# Patient Record
Sex: Male | Born: 1995 | Race: White | Hispanic: No | Marital: Single | Smoking: Never smoker
Health system: Southern US, Community
[De-identification: ages and names within clinical notes are randomized; demographics above are authoritative.]

---
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
## 2018-08-31 ENCOUNTER — Ambulatory Visit (HOSPITAL_COMMUNITY)
Admission: EM | Admit: 2018-08-31 | Discharge: 2018-08-31 | Disposition: A | Discharge status: Home / Self Care | Payer: Federal, State, Local not specified - PPO | Attending: Family Medicine | Admitting: Family Medicine

## 2018-08-31 ENCOUNTER — Other Ambulatory Visit: Payer: Self-pay

## 2018-08-31 ENCOUNTER — Encounter (HOSPITAL_COMMUNITY): Payer: Self-pay | Admitting: Emergency Medicine

## 2018-08-31 DIAGNOSIS — Z20828 Contact with and (suspected) exposure to other viral communicable diseases: Secondary | ICD-10-CM | POA: Diagnosis not present

## 2018-08-31 DIAGNOSIS — B9789 Other viral agents as the cause of diseases classified elsewhere: Secondary | ICD-10-CM

## 2018-08-31 DIAGNOSIS — R05 Cough: Secondary | ICD-10-CM | POA: Diagnosis present

## 2018-08-31 DIAGNOSIS — J069 Acute upper respiratory infection, unspecified: Secondary | ICD-10-CM | POA: Insufficient documentation

## 2018-08-31 NOTE — ED Provider Notes (Signed)
[facility]    CSN: [id number] Arrival date & time: 08/31/18  1502     History   Chief Complaint Chief Complaint  Patient presents with  . Cough    HPI Marc Kerr is a 23 y.o. male.   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  He just got back from a recent cruise to [place] and then visited [address]  He states that he went to [employer] on 08/26/2018.  Traveled home on 08/27/2018.  Began having symptoms on 08/29/2018.  For 2 days now has had body aches, low-grade fever, coughing and chest congestion, mild runny stuffy nose.  No sore throat.  No nausea vomiting diarrhea.  He has not taken his temperature much but he does not think it is been over 99-100. He does understand that being around large groups of people in [place] may increase his risk of exposure to coronavirus ([id number])  History reviewed. No pertinent past medical history.  There are no active problems to display for this patient.   History reviewed. No pertinent surgical history.     Home Medications    Prior to Admission medications   Not on File    Family History Family History  Problem Relation Age of Onset  . Healthy Mother   . Healthy Father     Social History Social History   Tobacco Use  . Smoking status: Never Smoker  . Smokeless tobacco: Never Used  Substance Use Topics  . Alcohol use: Not Currently    Frequency: Never  . Drug use: Never     Allergies   Patient has no known allergies.   Review of Systems Review of Systems  Constitutional: Positive for fever. Negative for chills.  HENT: Positive for congestion, postnasal drip and rhinorrhea. Negative for ear pain and sore throat.   Eyes: Negative for pain and visual disturbance.  Respiratory: Positive for cough. Negative for shortness of breath.   Cardiovascular: Negative for chest pain and palpitations.  Gastrointestinal: Negative for abdominal pain and vomiting.  Genitourinary: Negative for dysuria  and hematuria.  Musculoskeletal: Positive for myalgias. Negative for arthralgias and back pain.  Skin: Negative for color change and rash.  Neurological: Negative for seizures and syncope.  All other systems reviewed and are negative.    Physical Exam Triage Vital Signs ED Triage Vitals  Enc Vitals Group     BP 08/31/18 1519 102/61     Pulse Rate 08/31/18 1519 79     Resp 08/31/18 1519 16     Temp 08/31/18 1519 98.8 F (37.1 C)     Temp Source 08/31/18 1519 Oral     SpO2 08/31/18 1519 98 %   No data found.  Updated Vital Signs BP 102/61 (BP Location: Left Arm)   Pulse 79   Temp 98.8 F (37.1 C) (Oral) Comment: Pt has not taken any medications today  Resp 16   SpO2 98%      Physical Exam Constitutional:      General: He is not in acute distress.    Appearance: He is well-developed and normal weight. He is not ill-appearing.  HENT:     Head: Normocephalic and atraumatic.     Right Ear: Tympanic membrane and ear canal normal.     Left Ear: Tympanic membrane and ear canal normal.     Nose: Nose normal. No rhinorrhea.     Mouth/Throat:     Pharynx: No posterior oropharyngeal erythema.  Eyes:  Conjunctiva/sclera: Conjunctivae normal.     Pupils: Pupils are equal, round, and reactive to light.  Neck:     Musculoskeletal: Normal range of motion.  Cardiovascular:     Rate and Rhythm: Normal rate and regular rhythm.     Heart sounds: Normal heart sounds.  Pulmonary:     Effort: Pulmonary effort is normal. No respiratory distress.     Breath sounds: Normal breath sounds. No wheezing, rhonchi or rales.  Abdominal:     General: There is no distension.     Palpations: Abdomen is soft.  Musculoskeletal: Normal range of motion.  Lymphadenopathy:     Cervical: No cervical adenopathy.  Skin:    General: Skin is warm and dry.  Neurological:     Mental Status: He is alert.  Psychiatric:        Mood and Affect: Mood normal.        Behavior: Behavior normal.      UC  Treatments / Results  Labs (all labs ordered are listed, but only abnormal results are displayed) Labs Reviewed  NOVEL CORONAVIRUS, NAA (HOSPITAL ORDER, SEND-OUT TO REF LAB)    EKG None  Radiology No results found.  Procedures Procedures (including critical care time)  Medications Ordered in UC Medications - No data to display  Initial Impression / Assessment and Plan / UC Course  I have reviewed the triage vital signs and the nursing notes.  Pertinent labs & imaging results that were available during my care of the patient were reviewed by me and considered in my medical decision making (see chart for details).     Because of possible exposure to coronavirus via his travel to [place], he meets criteria for screening.  He was placed into an isolation room and seen in close encounter.  Tested for novel coronavirus. We had discussion about quarantine We had a discussion about contagion We had a discussion about his need to isolate himself and his family Patient expresses understanding Final Clinical Impressions(s) / UC Diagnoses   Final diagnoses:  Viral URI with cough     Discharge Instructions     Stay home until your coronavirus testing result is called to you Make sure we have an accurate phone number to reach you Your entire household needs to be quarantined with you You may take over-the-counter medicine for your cold symptoms, ibuprofen or acetaminophen for fever, Robitussin-DM for cough Call a medical provider if you have questions or feel worse instead of better.  The [facility] website has telemedicine available   ED Prescriptions    None     Controlled Substance Prescriptions [place] Controlled Substance Registry consulted? Not Applicable   Eustace Moore, MD 08/31/18 2022

## 2018-08-31 NOTE — Discharge Instructions (Addendum)
Stay home until your coronavirus testing result is called to you Make sure we have an accurate phone number to reach you Your entire household needs to be quarantined with you You may take over-the-counter medicine for your cold symptoms, ibuprofen or acetaminophen for fever, Robitussin-DM for cough Call a medical provider if you have questions or feel worse instead of better.  The [employer] website has telemedicine available

## 2018-08-31 NOTE — ED Notes (Signed)
Patient able to ambulate independently  

## 2018-08-31 NOTE — ED Triage Notes (Signed)
Pt presents to [facility] after getting back from a recent cruise to [place] and a visit to [place].  Patient states he was on a cruise to [place] 3/15, went to [place] on 3/15, traveled home to [facility] on 3/16, began having symptoms 3/18, and then traveled to [facility] 3/19.

## 2018-09-12 ENCOUNTER — Telehealth (HOSPITAL_COMMUNITY): Payer: Self-pay | Admitting: Emergency Medicine

## 2018-09-12 NOTE — Telephone Encounter (Signed)
Received call from lab for a positive covid 19.  Alvino Chapel reported lab result to this nurse.  Notified Dr Tracie Harrier.  Notified Leretha Dykes, RN-director of result.  Director has given instructions for follow up and notification of lab results.  M.Browning, RN-Director to handle notification.

## 2018-09-13 ENCOUNTER — Telehealth (HOSPITAL_COMMUNITY): Payer: Self-pay | Admitting: Emergency Medicine

## 2018-09-13 NOTE — Telephone Encounter (Signed)
Patient contacted and made aware of all results, all questions answered.   

## 2018-09-13 NOTE — Telephone Encounter (Signed)
Your test for COVID-19 was positive, meaning that you were infected with the novel coronavirus and could give the germ to others.  Please continue isolation at home, for at least 7 days since the start of your fever/cough/breathlessness and until you have had 3 consecutive days without fever (without taking a fever reducer) and with cough/breathlessness improving. Please continue good preventive care measures, including:  frequent hand-washing, avoid touching your face, cover coughs/sneezes, stay out of crowds and keep a 6 foot distance from others.  Recheck or go to the nearest hospital ED tent for re-assessment if fever/cough/breathlessness return.  Attempted to reach patient. No answer at this time. Voicemail left.

## 2018-09-17 LAB — NOVEL CORONAVIRUS, NAA (HOSP ORDER, SEND-OUT TO REF LAB; TAT 18-24 HRS): SARS-CoV-2, NAA: DETECTED — AB
